# Patient Record
Sex: Female | Born: 1980 | Race: Asian | Hispanic: No | Marital: Married | Smoking: Never smoker
Health system: Southern US, Community
[De-identification: ages and names within clinical notes are randomized; demographics above are authoritative.]

## PROBLEM LIST (undated history)

## (undated) ENCOUNTER — Inpatient Hospital Stay (HOSPITAL_COMMUNITY): Payer: Self-pay

## (undated) DIAGNOSIS — L409 Psoriasis, unspecified: Secondary | ICD-10-CM

## (undated) DIAGNOSIS — S52122A Displaced fracture of head of left radius, initial encounter for closed fracture: Secondary | ICD-10-CM

## (undated) HISTORY — PX: OTHER SURGICAL HISTORY: SHX169

---

## 2006-05-20 DIAGNOSIS — S52122A Displaced fracture of head of left radius, initial encounter for closed fracture: Secondary | ICD-10-CM

## 2006-05-20 HISTORY — PX: OTHER SURGICAL HISTORY: SHX169

## 2006-05-20 HISTORY — DX: Displaced fracture of head of left radius, initial encounter for closed fracture: S52.122A

## 2014-05-20 HISTORY — PX: REFRACTIVE SURGERY: SHX103

## 2017-08-25 ENCOUNTER — Inpatient Hospital Stay (HOSPITAL_COMMUNITY): Payer: BLUE CROSS/BLUE SHIELD

## 2017-08-25 ENCOUNTER — Inpatient Hospital Stay (HOSPITAL_COMMUNITY)
Admission: AD | Admit: 2017-08-25 | Discharge: 2017-08-25 | Disposition: A | Discharge status: Home / Self Care | Payer: BLUE CROSS/BLUE SHIELD | Source: Ambulatory Visit | Attending: Obstetrics and Gynecology | Admitting: Obstetrics and Gynecology

## 2017-08-25 ENCOUNTER — Other Ambulatory Visit: Payer: Self-pay

## 2017-08-25 ENCOUNTER — Encounter (HOSPITAL_COMMUNITY): Payer: Self-pay

## 2017-08-25 DIAGNOSIS — Z3A01 Less than 8 weeks gestation of pregnancy: Secondary | ICD-10-CM | POA: Insufficient documentation

## 2017-08-25 DIAGNOSIS — O209 Hemorrhage in early pregnancy, unspecified: Secondary | ICD-10-CM | POA: Insufficient documentation

## 2017-08-25 DIAGNOSIS — R109 Unspecified abdominal pain: Secondary | ICD-10-CM | POA: Diagnosis present

## 2017-08-25 DIAGNOSIS — O283 Abnormal ultrasonic finding on antenatal screening of mother: Secondary | ICD-10-CM

## 2017-08-25 DIAGNOSIS — O3680X0 Pregnancy with inconclusive fetal viability, not applicable or unspecified: Secondary | ICD-10-CM

## 2017-08-25 HISTORY — DX: Psoriasis, unspecified: L40.9

## 2017-08-25 LAB — HCG, QUANTITATIVE, PREGNANCY: HCG, BETA CHAIN, QUANT, S: 3077 m[IU]/mL — AB (ref <5)

## 2017-08-25 LAB — WET PREP, GENITAL
CLUE CELLS WET PREP: NONE SEEN
Sperm: NONE SEEN
Trich, Wet Prep: NONE SEEN
Yeast Wet Prep HPF POC: NONE SEEN

## 2017-08-25 LAB — CBC
HCT: 37.5 % (ref 36.0–46.0)
Hemoglobin: 12.6 g/dL (ref 12.0–15.0)
MCH: 29.8 pg (ref 26.0–34.0)
MCHC: 33.6 g/dL (ref 30.0–36.0)
MCV: 88.7 fL (ref 78.0–100.0)
PLATELETS: 277 10*3/uL (ref 150–400)
RBC: 4.23 MIL/uL (ref 3.87–5.11)
RDW: 13.4 % (ref 11.5–15.5)
WBC: 6.3 10*3/uL (ref 4.0–10.5)

## 2017-08-25 LAB — POCT PREGNANCY, URINE: PREG TEST UR: POSITIVE — AB

## 2017-08-25 LAB — ABO/RH: ABO/RH(D): AB POS

## 2017-08-25 NOTE — MAU Note (Signed)
Pt states she had one episode of a small gush of bright red bleeding last week that has since resolved to brown vaginal discharge.  Today she noticed bright red bleeding that she has needed a panty liner for.  States panty liner is not full, but discharge has been persistent. Endorses mild cramping for a couple weeks, describes it as "abdominal fullness".  Denies urinary symptoms or back pain. +HPT approx 3 weeks ago.

## 2017-08-25 NOTE — MAU Provider Note (Signed)
Chief Complaint: Vaginal Bleeding and Abdominal Cramping   First Provider Initiated Contact with Patient 08/25/17 1516      SUBJECTIVE HPI: Cassandra Diaz is a 37 y.o. G1P0 at 56w2dby sure LMP and ovulation kit who presents to maternity admissions reporting watery red vaginal spotting 1 week ago after exercise followed by intermittent brown discharge x 2-3 days. Then today, she had red spotting again. Bleeding has been light and required a panty liner only. She denies any pain. There are no other associated symptoms. She has not tried any treatments. She denies vaginal itching/burning, urinary symptoms, h/a, dizziness, n/v, or fever/chills.     HPI  Past Medical History:  Diagnosis Date  . Psoriasis    Past Surgical History:  Procedure Laterality Date  . left radial head replacement  2008  . REFRACTIVE SURGERY  2016   Social History   Socioeconomic History  . Marital status: Married    Spouse name: Not on file  . Number of children: Not on file  . Years of education: Not on file  . Highest education level: Not on file  Occupational History  . Not on file  Social Needs  . Financial resource strain: Not on file  . Food insecurity:    Worry: Not on file    Inability: Not on file  . Transportation needs:    Medical: Not on file    Non-medical: Not on file  Tobacco Use  . Smoking status: Never Smoker  . Smokeless tobacco: Never Used  Substance and Sexual Activity  . Alcohol use: Not Currently  . Drug use: Not Currently  . Sexual activity: Not on file  Lifestyle  . Physical activity:    Days per week: Not on file    Minutes per session: Not on file  . Stress: Not on file  Relationships  . Social connections:    Talks on phone: Not on file    Gets together: Not on file    Attends religious service: Not on file    Active member of club or organization: Not on file    Attends meetings of clubs or organizations: Not on file    Relationship status: Not on file  .  Intimate partner violence:    Fear of current or ex partner: Not on file    Emotionally abused: Not on file    Physically abused: Not on file    Forced sexual activity: Not on file  Other Topics Concern  . Not on file  Social History Narrative  . Not on file   No current facility-administered medications on file prior to encounter.    Current Outpatient Medications on File Prior to Encounter  Medication Sig Dispense Refill  . Prenatal Vit-Fe Fumarate-FA (PRENATAL MULTIVITAMIN) TABS tablet Take 1 tablet by mouth daily at 12 noon.     No Known Allergies  ROS:  Review of Systems  Constitutional: Negative for chills, fatigue and fever.  Respiratory: Negative for shortness of breath.   Cardiovascular: Negative for chest pain.  Gastrointestinal: Negative for nausea and vomiting.  Genitourinary: Positive for vaginal bleeding. Negative for difficulty urinating, dysuria, flank pain, pelvic pain, vaginal discharge and vaginal pain.  Neurological: Negative for dizziness and headaches.  Psychiatric/Behavioral: Negative.      I have reviewed patient's Past Medical Hx, Surgical Hx, Family Hx, Social Hx, medications and allergies.   Physical Exam   Patient Vitals for the past 24 hrs:  BP Temp Temp src Pulse Resp SpO2 Height Weight  08/25/17 1730 133/83 98.6 F (37 C) Oral 64 16 - - -  08/25/17 1422 127/80 98.7 F (37.1 C) Oral 67 16 100 % 5' 1"  (1.549 m) 134 lb (60.8 kg)   Constitutional: Well-developed, well-nourished female in no acute distress.  Cardiovascular: normal rate Respiratory: normal effort GI: Abd soft, non-tender. Pos BS x 4 MS: Extremities nontender, no edema, normal ROM Neurologic: Alert and oriented x 4.  GU: Neg CVAT.  PELVIC EXAM: Cervix pink, visually closed, friable to cotton swab, scant brown discharge vaginal walls and external genitalia normal Bimanual exam: Cervix 0/long/high, firm, anterior, neg CMT, uterus nontender, nonenlarged, adnexa without  tenderness, enlargement, or mass   LAB RESULTS Results for orders placed or performed during the hospital encounter of 08/25/17 (from the past 24 hour(s))  Pregnancy, urine POC     Status: Abnormal   Collection Time: 08/25/17  2:27 PM  Result Value Ref Range   Preg Test, Ur POSITIVE (A) NEGATIVE  Wet prep, genital     Status: Abnormal   Collection Time: 08/25/17  3:32 PM  Result Value Ref Range   Yeast Wet Prep HPF POC NONE SEEN NONE SEEN   Trich, Wet Prep NONE SEEN NONE SEEN   Clue Cells Wet Prep HPF POC NONE SEEN NONE SEEN   WBC, Wet Prep HPF POC FEW (A) NONE SEEN   Sperm NONE SEEN   CBC     Status: None   Collection Time: 08/25/17  3:46 PM  Result Value Ref Range   WBC 6.3 4.0 - 10.5 K/uL   RBC 4.23 3.87 - 5.11 MIL/uL   Hemoglobin 12.6 12.0 - 15.0 g/dL   HCT 37.5 36.0 - 46.0 %   MCV 88.7 78.0 - 100.0 fL   MCH 29.8 26.0 - 34.0 pg   MCHC 33.6 30.0 - 36.0 g/dL   RDW 13.4 11.5 - 15.5 %   Platelets 277 150 - 400 K/uL  hCG, quantitative, pregnancy     Status: Abnormal   Collection Time: 08/25/17  3:46 PM  Result Value Ref Range   hCG, Beta Chain, Quant, S 3,077 (H) <5 mIU/mL  ABO/Rh     Status: None (Preliminary result)   Collection Time: 08/25/17  3:46 PM  Result Value Ref Range   ABO/RH(D)      AB POS Performed at [facility], [address], [place], [place] [address]     --/--/AB POS Performed at [facility], [address], [place], [place] [address]  [phone]/08 [phone])  IMAGING US Ob Comp Less 14 Wks  Result Date: 08/25/2017 CLINICAL DATA:  Vaginal bleeding. EXAM: OBSTETRIC <14 WK [place] AND TRANSVAGINAL OB US TECHNIQUE: Both transabdominal and transvaginal ultrasound examinations were performed for complete evaluation of the gestation as well as the maternal uterus, adnexal regions, and pelvic cul-de-sac. Transvaginal technique was performed to assess early pregnancy. COMPARISON:  None. FINDINGS: Intrauterine gestational sac: Single Yolk sac:  No Embryo:  None  Cardiac Activity: No MSD: 5.94 mm   5 w   2 d Maternal uterus/adnexae: Subchorionic hemorrhage: None Right ovary: Simple cyst in the right ovary measures 3.5 cm. Left ovary: Appears normal. Other :None Free fluid:  Trace IMPRESSION: 1. Probable early intrauterine gestational sac, but no yolk sac, fetal pole, or cardiac activity yet visualized. Recommend follow-up quantitative B-HCG levels and follow-up US in 14 days to confirm and assess viability. This recommendation follows SRU consensus guidelines: Diagnostic Criteria for Nonviable Pregnancy Early in the First Trimester. N Engl J Med 2013;  573:2202-54. Simple appearing right ovary cyst. 2. Trace free fluid within the pelvis. Electronically Signed   By: Kerby Moors M.D.   On: 08/25/2017 16:22   US Ob Transvaginal  Result Date: 08/25/2017 CLINICAL DATA:  Vaginal bleeding. EXAM: OBSTETRIC <14 WK [place] AND TRANSVAGINAL OB US TECHNIQUE: Both transabdominal and transvaginal ultrasound examinations were performed for complete evaluation of the gestation as well as the maternal uterus, adnexal regions, and pelvic cul-de-sac. Transvaginal technique was performed to assess early pregnancy. COMPARISON:  None. FINDINGS: Intrauterine gestational sac: Single Yolk sac:  No Embryo:  None Cardiac Activity: No MSD: 5.94 mm   5 w   2 d Maternal uterus/adnexae: Subchorionic hemorrhage: None Right ovary: Simple cyst in the right ovary measures 3.5 cm. Left ovary: Appears normal. Other :None Free fluid:  Trace IMPRESSION: 1. Probable early intrauterine gestational sac, but no yolk sac, fetal pole, or cardiac activity yet visualized. Recommend follow-up quantitative B-HCG levels and follow-up US in 14 days to confirm and assess viability. This recommendation follows SRU consensus guidelines: Diagnostic Criteria for Nonviable Pregnancy Early in the First Trimester. [place] Med 2013; 270:6237-62. Simple appearing right ovary cyst. 2. Trace free fluid within the pelvis. Electronically  Signed   By: Kerby Moors M.D.   On: 08/25/2017 16:22    MAU Management/MDM: Ordered labs and reviewed results.  Findings today could represent a normal early pregnancy, spontaneous abortion or ectopic pregnancy which can be life-threatening.  Ectopic precautions were given to the patient with plan to return in 48 hours for repeat quant hcg to evaluate pregnancy development.  Consult Dr Willis Modena with assessment and findings. Pt to call the office for quant hcg in 48 hours.  Return to MAU as needed for heavy bleeding or pain.  Pt discharged with strict ectopic precautions.  ASSESSMENT 1. Pregnancy of unknown anatomic location   2. Vaginal bleeding in pregnancy, first trimester     PLAN Discharge home Allergies as of 08/25/2017   No Known Allergies     Medication List    TAKE these medications   prenatal multivitamin Tabs tablet Take 1 tablet by mouth daily at 12 noon.      Follow-up Information    Meisinger, Sherren Mocha, MD Follow up.   Specialty:  Obstetrics and Gynecology Why:  Call for appointment on Wednesday, 08/27/17, for pregnancy hormone level. Return to MAU as needed for emergencies.  Contact information: [address], [address] [place] [address] [phone]           Fatima Blank Certified Nurse-Midwife 08/25/2017  5:57 PM

## 2017-08-26 ENCOUNTER — Telehealth: Payer: Self-pay | Admitting: Advanced Practice Midwife

## 2017-08-26 DIAGNOSIS — O3680X0 Pregnancy with inconclusive fetal viability, not applicable or unspecified: Secondary | ICD-10-CM | POA: Insufficient documentation

## 2017-08-26 LAB — GC/CHLAMYDIA PROBE AMP (~~LOC~~) NOT AT ARMC
Chlamydia: NEGATIVE
Neisseria Gonorrhea: NEGATIVE

## 2017-08-26 LAB — HIV ANTIBODY (ROUTINE TESTING W REFLEX): HIV SCREEN 4TH GENERATION: NONREACTIVE

## 2017-08-26 NOTE — Telephone Encounter (Signed)
Came to MAU for F/U quant. W/as seen in MAU yesterday afternoon. Dx PUL. No worsening of Sx. Called Dr. Mindi SlickerBanga to clarify where pt should F/U and when. Needs quant 4/10 afternoon. [facility] going to [facility] Dr. Mindi SlickerBanga will call pt to let her know what her partners prefer. Instructed pt that is she isn't [facility] she should F/U at [facility] 4/10 at 1400.  Katrinka BlazingSmith, [place], [place] 08/26/2017 10:59 PM'

## 2017-08-27 ENCOUNTER — Ambulatory Visit: Payer: BLUE CROSS/BLUE SHIELD

## 2017-08-29 ENCOUNTER — Telehealth: Payer: Self-pay

## 2017-08-29 NOTE — Telephone Encounter (Signed)
LM for pt to return call to reschedule her missed appt and that I am also sending a MyChart message.

## 2018-05-20 NOTE — L&D Delivery Note (Addendum)
Delivery Note Ptt pushed very well for about a half-hour for delivery.  At 11:26 PM a viable and healthy female was delivered via  (Presentation: OA; LOT).  APGAR: 9, 9; weight P .   Placenta status: delivered, intact.  Cord: 3V  with the following complications: none.    Anesthesia: epidural  Episiotomy:  none Lacerations: 2nd degree Suture Repair: 3.0 vicryl rapide Est. Blood Loss (mL):  175cc  Mom to postpartum.  Baby to Couplet care / Skin to Skin.  Cassandra Diaz 04/02/2019, 11:52 PM  AB+/RI/Tdap in PNC/Br/Contra?

## 2018-06-24 ENCOUNTER — Other Ambulatory Visit: Payer: Self-pay

## 2018-06-24 ENCOUNTER — Encounter (HOSPITAL_BASED_OUTPATIENT_CLINIC_OR_DEPARTMENT_OTHER): Payer: Self-pay

## 2018-06-24 NOTE — Progress Notes (Signed)
Spoke with:  Aira NPO:  No food after midnight/Clear liquids until 4:30AM DOS, Pre surgery drink at 4:30AM Arrival time:  0530AM Labs: UPT (CBC 06/26/2018 in epic) AM medications:  None Pre op orders: Yes Ride home:  Autumn Patty (husband) [phone]

## 2018-06-26 ENCOUNTER — Encounter (HOSPITAL_COMMUNITY)
Admission: RE | Admit: 2018-06-26 | Discharge: 2018-06-26 | Disposition: A | Discharge status: Home / Self Care | Payer: PRIVATE HEALTH INSURANCE | Source: Ambulatory Visit | Attending: Obstetrics and Gynecology | Admitting: Obstetrics and Gynecology

## 2018-06-26 DIAGNOSIS — Z01812 Encounter for preprocedural laboratory examination: Secondary | ICD-10-CM | POA: Insufficient documentation

## 2018-06-26 LAB — CBC
HCT: 36.2 % (ref 36.0–46.0)
Hemoglobin: 11.8 g/dL — ABNORMAL LOW (ref 12.0–15.0)
MCH: 30.6 pg (ref 26.0–34.0)
MCHC: 32.6 g/dL (ref 30.0–36.0)
MCV: 93.8 fL (ref 80.0–100.0)
Platelets: 256 10*3/uL (ref 150–400)
RBC: 3.86 MIL/uL — ABNORMAL LOW (ref 3.87–5.11)
RDW: 12.1 % (ref 11.5–15.5)
WBC: 5.2 10*3/uL (ref 4.0–10.5)
nRBC: 0 % (ref 0.0–0.2)

## 2018-06-29 NOTE — Anesthesia Preprocedure Evaluation (Addendum)
Anesthesia Evaluation  Patient identified by MRN, date of birth, ID band Patient awake    Reviewed: Allergy & Precautions, NPO status , Patient's Chart, lab work & pertinent test results  History of Anesthesia Complications Negative for: history of anesthetic complications  Airway Mallampati: I  TM Distance: >3 FB Neck ROM: Full    Dental  (+) Teeth Intact, Dental Advisory Given   Pulmonary neg pulmonary ROS,    Pulmonary exam normal breath sounds clear to auscultation       Cardiovascular negative cardio ROS Normal cardiovascular exam Rhythm:Regular Rate:Normal     Neuro/Psych negative neurological ROS     GI/Hepatic negative GI ROS, Neg liver ROS,   Endo/Other  negative endocrine ROS  Renal/GU negative Renal ROS     Musculoskeletal negative musculoskeletal ROS (+)   Abdominal   Peds  Hematology negative hematology ROS (+)   Anesthesia Other Findings Day of surgery medications reviewed with the patient.  Reproductive/Obstetrics Endometrial polyps                            Anesthesia Physical Anesthesia Plan  ASA: I  Anesthesia Plan: General   Post-op Pain Management:    Induction: Intravenous  PONV Risk Score and Plan: 3 and Treatment may vary due to age or medical condition, Ondansetron, Dexamethasone, Midazolam and Scopolamine patch - Pre-op  Airway Management Planned: LMA  Additional Equipment:   Intra-op Plan:   Post-operative Plan: Extubation in OR  Informed Consent: I have reviewed the patients History and Physical, chart, labs and discussed the procedure including the risks, benefits and alternatives for the proposed anesthesia with the patient or authorized representative who has indicated his/her understanding and acceptance.     Dental advisory given  Plan Discussed with: CRNA  Anesthesia Plan Comments:        Anesthesia Quick Evaluation

## 2018-06-29 NOTE — H&P (Signed)
Cassandra Diaz is an 38 y.o. female. She recently had an HSG for work-up of infertility which revealed an irregular endometrium consistent with polyp, read by Dr. April Manson.  He recommended hysteroscopy, D&C and resection of possible polyps.  She has been having regular menses monthly without problems.   Pertinent Gynecological History: Last pap: normal Date: 12/2016 OB History: G1, P0010 SAB   Menstrual History: Patient's last menstrual period was 06/06/2017 (exact date).    Past Medical History:  Diagnosis Date  . Left radial head fracture 2008  . Psoriasis     Past Surgical History:  Procedure Laterality Date  . left radial head replacement  2008  . REFRACTIVE SURGERY  2016    History reviewed. No pertinent family history.  Social History:  reports that she has never smoked. She has never used smokeless tobacco. She reports current alcohol use. She reports previous drug use.  Allergies: No Known Allergies  No medications prior to admission.    Review of Systems  Respiratory: Negative.   Cardiovascular: Negative.     Height 5\' 1"  (1.549 m), weight 63.5 kg, last menstrual period 06/06/2017, unknown if currently breastfeeding. Physical Exam  Constitutional: She appears well-developed and well-nourished.  Neck: Neck supple. No thyromegaly present.  Cardiovascular: Normal rate, regular rhythm and normal heart sounds.  No murmur heard. Respiratory: Effort normal and breath sounds normal. No respiratory distress. She has no wheezes.  GI: Soft. She exhibits no distension and no mass. There is no abdominal tenderness.  Genitourinary:    Vagina and uterus normal.     No results found for this or any previous visit (from the past 24 hour(s)).  No results found.  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  Discussed surgical procedure and risks.  Will admit for hysteroscopy/D&C and possible resection of polyps.  Cassandra Diaz  Cassandra Diaz 06/29/2018, 5:19 PM

## 2018-06-30 ENCOUNTER — Encounter (HOSPITAL_BASED_OUTPATIENT_CLINIC_OR_DEPARTMENT_OTHER): Payer: Self-pay

## 2018-06-30 ENCOUNTER — Other Ambulatory Visit: Payer: Self-pay

## 2018-06-30 ENCOUNTER — Encounter (HOSPITAL_BASED_OUTPATIENT_CLINIC_OR_DEPARTMENT_OTHER)
Admission: RE | Disposition: A | Discharge status: Home / Self Care | Payer: Self-pay | Source: Other Acute Inpatient Hospital | Attending: Obstetrics and Gynecology

## 2018-06-30 ENCOUNTER — Ambulatory Visit (HOSPITAL_BASED_OUTPATIENT_CLINIC_OR_DEPARTMENT_OTHER): Payer: PRIVATE HEALTH INSURANCE | Admitting: Anesthesiology

## 2018-06-30 ENCOUNTER — Ambulatory Visit (HOSPITAL_BASED_OUTPATIENT_CLINIC_OR_DEPARTMENT_OTHER)
Admission: RE | Admit: 2018-06-30 | Discharge: 2018-06-30 | Disposition: A | Discharge status: Home / Self Care | Payer: PRIVATE HEALTH INSURANCE | Source: Other Acute Inpatient Hospital | Attending: Obstetrics and Gynecology | Admitting: Obstetrics and Gynecology

## 2018-06-30 DIAGNOSIS — N840 Polyp of corpus uteri: Secondary | ICD-10-CM | POA: Diagnosis present

## 2018-06-30 DIAGNOSIS — Z96622 Presence of left artificial elbow joint: Secondary | ICD-10-CM | POA: Diagnosis not present

## 2018-06-30 DIAGNOSIS — N979 Female infertility, unspecified: Secondary | ICD-10-CM | POA: Diagnosis not present

## 2018-06-30 HISTORY — PX: DILATATION & CURETTAGE/HYSTEROSCOPY WITH MYOSURE: SHX6511

## 2018-06-30 HISTORY — DX: Displaced fracture of head of left radius, initial encounter for closed fracture: S52.122A

## 2018-06-30 LAB — POCT PREGNANCY, URINE: Preg Test, Ur: NEGATIVE

## 2018-06-30 SURGERY — DILATATION & CURETTAGE/HYSTEROSCOPY WITH MYOSURE
Anesthesia: General | Site: Vagina

## 2018-06-30 MED ORDER — ONDANSETRON HCL 4 MG/2ML IJ SOLN
INTRAMUSCULAR | Status: AC
  Filled 2018-06-30: qty 2

## 2018-06-30 MED ORDER — LIDOCAINE HCL 2 % IJ SOLN
INTRAMUSCULAR | Status: DC | PRN
  Administered 2018-06-30: 16 mL

## 2018-06-30 MED ORDER — MIDAZOLAM HCL 5 MG/5ML IJ SOLN
INTRAMUSCULAR | Status: DC | PRN
  Administered 2018-06-30: 2 mg via INTRAVENOUS

## 2018-06-30 MED ORDER — KETOROLAC TROMETHAMINE 30 MG/ML IJ SOLN
INTRAMUSCULAR | Status: AC
  Filled 2018-06-30: qty 1

## 2018-06-30 MED ORDER — LACTATED RINGERS IV SOLN
INTRAVENOUS | Status: DC
  Administered 2018-06-30: 1000 mL via INTRAVENOUS
  Administered 2018-06-30: 06:00:00 via INTRAVENOUS
  Filled 2018-06-30: qty 1000

## 2018-06-30 MED ORDER — PROMETHAZINE HCL 25 MG/ML IJ SOLN
6.2500 mg | INTRAMUSCULAR | Status: DC | PRN
  Filled 2018-06-30: qty 1

## 2018-06-30 MED ORDER — OXYCODONE HCL 5 MG/5ML PO SOLN
5.0000 mg | Freq: Once | ORAL | Status: DC | PRN
  Filled 2018-06-30: qty 5

## 2018-06-30 MED ORDER — GLYCOPYRROLATE PF 0.2 MG/ML IJ SOSY
PREFILLED_SYRINGE | INTRAMUSCULAR | Status: AC
  Filled 2018-06-30: qty 1

## 2018-06-30 MED ORDER — MIDAZOLAM HCL 2 MG/2ML IJ SOLN
INTRAMUSCULAR | Status: AC
  Filled 2018-06-30: qty 2

## 2018-06-30 MED ORDER — GLYCOPYRROLATE 0.2 MG/ML IJ SOLN
INTRAMUSCULAR | Status: DC | PRN
  Administered 2018-06-30: 0.2 mg via INTRAVENOUS

## 2018-06-30 MED ORDER — SCOPOLAMINE 1 MG/3DAYS TD PT72
MEDICATED_PATCH | TRANSDERMAL | Status: DC | PRN
  Administered 2018-06-30: 1 via TRANSDERMAL

## 2018-06-30 MED ORDER — DEXAMETHASONE SODIUM PHOSPHATE 10 MG/ML IJ SOLN
INTRAMUSCULAR | Status: AC
  Filled 2018-06-30: qty 1

## 2018-06-30 MED ORDER — FENTANYL CITRATE (PF) 100 MCG/2ML IJ SOLN
INTRAMUSCULAR | Status: DC | PRN
  Administered 2018-06-30: 25 ug via INTRAVENOUS
  Administered 2018-06-30: 50 ug via INTRAVENOUS
  Administered 2018-06-30: 25 ug via INTRAVENOUS

## 2018-06-30 MED ORDER — PROPOFOL 10 MG/ML IV BOLUS
INTRAVENOUS | Status: AC
  Filled 2018-06-30: qty 40

## 2018-06-30 MED ORDER — SODIUM CHLORIDE 0.9 % IR SOLN
Status: DC | PRN
  Administered 2018-06-30: 3000 mL

## 2018-06-30 MED ORDER — KETOROLAC TROMETHAMINE 30 MG/ML IJ SOLN
INTRAMUSCULAR | Status: DC | PRN
  Administered 2018-06-30: 30 mg via INTRAVENOUS

## 2018-06-30 MED ORDER — ONDANSETRON HCL 4 MG/2ML IJ SOLN
INTRAMUSCULAR | Status: DC | PRN
  Administered 2018-06-30: 4 mg via INTRAVENOUS

## 2018-06-30 MED ORDER — OXYCODONE HCL 5 MG PO TABS
5.0000 mg | ORAL_TABLET | Freq: Once | ORAL | Status: DC | PRN
  Filled 2018-06-30: qty 1

## 2018-06-30 MED ORDER — ACETAMINOPHEN 10 MG/ML IV SOLN
1000.0000 mg | Freq: Once | INTRAVENOUS | Status: DC | PRN
  Filled 2018-06-30: qty 100

## 2018-06-30 MED ORDER — SCOPOLAMINE 1 MG/3DAYS TD PT72
MEDICATED_PATCH | TRANSDERMAL | Status: AC
  Filled 2018-06-30: qty 1

## 2018-06-30 MED ORDER — LIDOCAINE HCL (CARDIAC) PF 100 MG/5ML IV SOSY
PREFILLED_SYRINGE | INTRAVENOUS | Status: DC | PRN
  Administered 2018-06-30: 80 mg via INTRAVENOUS

## 2018-06-30 MED ORDER — PROPOFOL 10 MG/ML IV BOLUS
INTRAVENOUS | Status: DC | PRN
  Administered 2018-06-30: 120 mg via INTRAVENOUS

## 2018-06-30 MED ORDER — FENTANYL CITRATE (PF) 100 MCG/2ML IJ SOLN
INTRAMUSCULAR | Status: AC
  Filled 2018-06-30: qty 2

## 2018-06-30 MED ORDER — FENTANYL CITRATE (PF) 100 MCG/2ML IJ SOLN
25.0000 ug | INTRAMUSCULAR | Status: DC | PRN
  Filled 2018-06-30: qty 1

## 2018-06-30 MED ORDER — DEXAMETHASONE SODIUM PHOSPHATE 4 MG/ML IJ SOLN
INTRAMUSCULAR | Status: DC | PRN
  Administered 2018-06-30: 10 mg via INTRAVENOUS

## 2018-06-30 SURGICAL SUPPLY — 34 items
BIPOLAR CUTTING LOOP 21FR (ELECTRODE)
CANISTER SUCT 3000ML PPV (MISCELLANEOUS) ×3 IMPLANT
CATH ROBINSON RED A/P 16FR (CATHETERS) ×3 IMPLANT
COVER WAND RF STERILE (DRAPES) ×3 IMPLANT
DEVICE MYOSURE LITE (MISCELLANEOUS) ×3 IMPLANT
DEVICE MYOSURE REACH (MISCELLANEOUS) IMPLANT
DILATOR CANAL MILEX (MISCELLANEOUS) IMPLANT
ELECT REM PT RETURN 9FT ADLT (ELECTROSURGICAL)
ELECTRODE REM PT RTRN 9FT ADLT (ELECTROSURGICAL) IMPLANT
GAUZE 4X4 16PLY RFD (DISPOSABLE) ×3 IMPLANT
GLOVE BIO SURGEON STRL SZ 6.5 (GLOVE) ×2 IMPLANT
GLOVE BIO SURGEON STRL SZ7 (GLOVE) ×3 IMPLANT
GLOVE BIO SURGEONS STRL SZ 6.5 (GLOVE) ×1
GLOVE BIOGEL PI IND STRL 6.5 (GLOVE) ×1 IMPLANT
GLOVE BIOGEL PI IND STRL 7.0 (GLOVE) ×1 IMPLANT
GLOVE BIOGEL PI INDICATOR 6.5 (GLOVE) ×2
GLOVE BIOGEL PI INDICATOR 7.0 (GLOVE) ×2
GLOVE ORTHO TXT STRL SZ7.5 (GLOVE) ×3 IMPLANT
GOWN STRL REUS W/ TWL XL LVL3 (GOWN DISPOSABLE) ×1 IMPLANT
GOWN STRL REUS W/TWL LRG LVL3 (GOWN DISPOSABLE) ×6 IMPLANT
GOWN STRL REUS W/TWL XL LVL3 (GOWN DISPOSABLE) ×2
IV NS IRRIG 3000ML ARTHROMATIC (IV SOLUTION) ×3 IMPLANT
KIT PROCEDURE FLUENT (KITS) ×3 IMPLANT
KIT TURNOVER CYSTO (KITS) ×3 IMPLANT
LOOP CUTTING BIPOLAR 21FR (ELECTRODE) IMPLANT
MYOSURE LITE POLYP REMOVAL (MISCELLANEOUS) IMPLANT
MYOSURE XL FIBROID (MISCELLANEOUS)
PACK VAGINAL MINOR WOMEN LF (CUSTOM PROCEDURE TRAY) ×3 IMPLANT
PAD OB MATERNITY 4.3X12.25 (PERSONAL CARE ITEMS) ×3 IMPLANT
PAD PREP 24X48 CUFFED NSTRL (MISCELLANEOUS) ×3 IMPLANT
SEAL ROD LENS SCOPE MYOSURE (ABLATOR) ×3 IMPLANT
SYSTEM TISS REMOVAL MYOSURE XL (MISCELLANEOUS) IMPLANT
TOWEL OR 17X26 10 PK STRL BLUE (TOWEL DISPOSABLE) ×6 IMPLANT
WATER STERILE IRR 500ML POUR (IV SOLUTION) IMPLANT

## 2018-06-30 NOTE — Anesthesia Procedure Notes (Signed)
Procedure Name: LMA Insertion Date/Time: 06/30/2018 7:27 AM Performed by: Jessica Priest, CRNA Pre-anesthesia Checklist: Patient identified, Emergency Drugs available, Suction available and Patient being monitored Patient Re-evaluated:Patient Re-evaluated prior to induction Oxygen Delivery Method: Circle system utilized Preoxygenation: Pre-oxygenation with 100% oxygen Induction Type: IV induction Ventilation: Mask ventilation without difficulty LMA: LMA inserted LMA Size: 4.0 Number of attempts: 1 Airway Equipment and Method: Bite block Placement Confirmation: positive ETCO2 and breath sounds checked- equal and bilateral Tube secured with: Tape Dental Injury: Teeth and Oropharynx as per pre-operative assessment

## 2018-06-30 NOTE — Anesthesia Postprocedure Evaluation (Signed)
Anesthesia Post Note  Patient: Surveyor, mining  Procedure(s) Performed: DILATATION & CURETTAGE/HYSTEROSCOPY WITH MYOSURE (N/A Vagina )     Patient location during evaluation: PACU Anesthesia Type: General Level of consciousness: awake and alert Pain management: pain level controlled Vital Signs Assessment: post-procedure vital signs reviewed and stable Respiratory status: spontaneous breathing, nonlabored ventilation and respiratory function stable Cardiovascular status: blood pressure returned to baseline and stable Postop Assessment: no apparent nausea or vomiting Anesthetic complications: no    Last Vitals:  Vitals:   06/30/18 0547 06/30/18 0811  BP: 108/76 103/67  Pulse: (!) 56 84  Resp: 14 18  Temp: 37.1 C 36.7 C  SpO2: 100% 100%    Last Pain:  Vitals:   06/30/18 0811  TempSrc:   PainSc: 0-No pain                 Kaylyn Layer

## 2018-06-30 NOTE — Transfer of Care (Signed)
Immediate Anesthesia Transfer of Care Note  Patient: Cassandra Diaz  Procedure(s) Performed: Procedure(s) (LRB): DILATATION & CURETTAGE/HYSTEROSCOPY WITH MYOSURE (N/A)  Patient Location: PACU  Anesthesia Type: General  Level of Consciousness: awake, sedated, patient cooperative and responds to stimulation  Airway & Oxygen Therapy: Patient Spontanous Breathing and Patient connected to [place] oxygen  Post-op Assessment: Report given to PACU RN, Post -op Vital signs reviewed and stable and Patient moving all extremities  Post vital signs: Reviewed and stable  Complications: No apparent anesthesia complications

## 2018-06-30 NOTE — Discharge Instructions (Signed)
DISCHARGE INSTRUCTIONS: D&C    Personal hygiene:  Use sanitary pads for vaginal drainage, not tampons.  Shower the day after your procedure.  NO tub baths, pools or Jacuzzis for 2-3 weeks.  Wipe front to back after using the bathroom.  Activity and limitations:  Do NOT drive or operate any equipment for 24 hours. The effects of anesthesia are still present and drowsiness may result.  Do NOT rest in bed all day.  Walking is encouraged.  Walk up and down stairs slowly.  You may resume your normal activity in one to two days or as indicated by your physician.  Sexual activity: NO intercourse for at least 2 weeks after the procedure, or as indicated by your physician.  Diet: Eat a light meal as desired this evening. You may resume your usual diet tomorrow.  Return to work: You may resume your work activities in one to two days or as indicated by your doctor.  What to expect after your surgery: Expect to have vaginal bleeding/discharge for 2-3 days and spotting for up to 10 days. It is not unusual to have soreness for up to 1-2 weeks. You may have a slight burning sensation when you urinate for the first day. Mild cramps may continue for a couple of days. You may have a regular period in 2-6 weeks.  Call your doctor for any of the following:  Excessive vaginal bleeding, saturating and changing one pad every hour.  Inability to urinate 6 hours after discharge from hospital.  Pain not relieved by pain medication.  Fever of 100.4 F or greater.  Unusual vaginal discharge or odor.   Post Anesthesia Home Care Instructions  Activity: Get plenty of rest for the remainder of the day. A responsible individual must stay with you for 24 hours following the procedure.  For the next 24 hours, DO NOT: -Drive a car -Advertising copywriterperate machinery -Drink alcoholic beverages -Take any medication unless instructed by your physician -Make any legal decisions or sign important  papers.  Meals: Start with liquid foods such as gelatin or soup. Progress to regular foods as tolerated. Avoid greasy, spicy, heavy foods. If nausea and/or vomiting occur, drink only clear liquids until the nausea and/or vomiting subsides. Call your physician if vomiting continues.  Special Instructions/Symptoms: Your throat may feel dry or sore from the anesthesia or the breathing tube placed in your throat during surgery. If this causes discomfort, gargle with warm salt water. The discomfort should disappear within 24 hours.  If you had a scopolamine patch placed behind your ear for the management of post- operative nausea and/or vomiting:  1. The medication in the patch is effective for 72 hours, after which it should be removed.  Wrap patch in a tissue and discard in the trash. Wash hands thoroughly with soap and water. 2. You may remove the patch earlier than 72 hours if you experience unpleasant side effects which may include dry mouth, dizziness or visual disturbances. 3. Avoid touching the patch. Wash your hands with soap and water after contact with the patch.

## 2018-06-30 NOTE — Op Note (Signed)
Preoperative diagnosis: Possible endometrial polyps by HSG Postoperative diagnosis: Same Procedure: Hysteroscopy, D&C,  with Myosure resection of possible polyps Surgeon: Lavina Hamman M.D. Anesthesia: Gen. With an LMA, paracervical block Findings: She had a normal endometrial cavity with abundant polypoid endometrial tissue Estimated blood loss: Minimal Fluid deficit: Through the hysteroscope fluid deficit was < 200 cc Specimens: Endometrial currettings and resection sent for routine pathology Complications: None  Procedure in detail: The patient was taken to the operating room and placed in the dorsosupine position. General anesthesia was induced. She was placed in mobile stirrups and legs were elevated. Perineum and vagina were prepped and draped in the usual sterile fashion and bladder drained with a red Robinson catheter. A Graves speculum was inserted in the vagina and the anterior lip of the cervix was grasped with a single-tooth tenaculum. Paracervical block was performed with a total of 16 cc of 2% plain lidocaine. The uterus then sounded to 8 cm. The cervix was gradually dilated to a size 7 Hegar dilator without difficulty. The Myosure hysteroscope was inserted and good visualization was achieved.  Initial assessment of the cavity was difficult due to abundant polypoid endometrial tissue.  D&C was performed with removal of a good amount of tissue, some polypoid.  Polyp forceps also used to remove an additional small amount of fluid. The Myosure Lite was inserted and all remaining endometrial tissue was completely resected.  The endometrial cavity was now normal, no other visible lesions.  The hysteroscope was removed. The single-tooth tenaculum was removed from the cervix and bleeding was controlled with pressure. All instruments were then removed from the vagina. The patient tolerated the procedure well and was taken to the recovery in stable condition. Counts were correct and she had PAS  hose on throughout the procedure.

## 2018-06-30 NOTE — Interval H&P Note (Signed)
History and Physical Interval Note:  06/30/2018 7:14 AM  Cassandra Diaz  has presented today for surgery, with the diagnosis of possible endometrial polyps  The various methods of treatment have been discussed with the patient and family. After consideration of risks, benefits and other options for treatment, the patient has consented to  Procedure(s): DILATATION & CURETTAGE/HYSTEROSCOPY WITH POSS MYOSURE (N/A) as a surgical intervention .  The patient's history has been reviewed, patient examined, no change in status, stable for surgery.  I have reviewed the patient's chart and labs.  Questions were answered to the patient's satisfaction.     Leighton Roach Joleah Kosak

## 2018-07-01 ENCOUNTER — Encounter (HOSPITAL_BASED_OUTPATIENT_CLINIC_OR_DEPARTMENT_OTHER): Payer: Self-pay | Admitting: Obstetrics and Gynecology

## 2019-04-02 ENCOUNTER — Other Ambulatory Visit: Payer: Self-pay

## 2019-04-02 ENCOUNTER — Inpatient Hospital Stay (HOSPITAL_COMMUNITY)
Admission: AD | Admit: 2019-04-02 | Discharge: 2019-04-04 | DRG: 807 | Disposition: A | Discharge status: Home / Self Care | Payer: PRIVATE HEALTH INSURANCE | Attending: Obstetrics and Gynecology | Admitting: Obstetrics and Gynecology

## 2019-04-02 ENCOUNTER — Inpatient Hospital Stay (HOSPITAL_COMMUNITY): Payer: PRIVATE HEALTH INSURANCE | Admitting: Anesthesiology

## 2019-04-02 ENCOUNTER — Encounter (HOSPITAL_COMMUNITY): Payer: Self-pay | Admitting: *Deleted

## 2019-04-02 DIAGNOSIS — E669 Obesity, unspecified: Secondary | ICD-10-CM | POA: Diagnosis present

## 2019-04-02 DIAGNOSIS — O26893 Other specified pregnancy related conditions, third trimester: Secondary | ICD-10-CM | POA: Diagnosis present

## 2019-04-02 DIAGNOSIS — O99214 Obesity complicating childbirth: Principal | ICD-10-CM | POA: Diagnosis present

## 2019-04-02 DIAGNOSIS — Z3A38 38 weeks gestation of pregnancy: Secondary | ICD-10-CM

## 2019-04-02 DIAGNOSIS — Z20828 Contact with and (suspected) exposure to other viral communicable diseases: Secondary | ICD-10-CM | POA: Diagnosis present

## 2019-04-02 LAB — CBC
HCT: 39.7 % (ref 36.0–46.0)
Hemoglobin: 13.3 g/dL (ref 12.0–15.0)
MCH: 31.2 pg (ref 26.0–34.0)
MCHC: 33.5 g/dL (ref 30.0–36.0)
MCV: 93.2 fL (ref 80.0–100.0)
Platelets: 290 10*3/uL (ref 150–400)
RBC: 4.26 MIL/uL (ref 3.87–5.11)
RDW: 12.7 % (ref 11.5–15.5)
WBC: 11.1 10*3/uL — ABNORMAL HIGH (ref 4.0–10.5)
nRBC: 0 % (ref 0.0–0.2)

## 2019-04-02 LAB — OB RESULTS CONSOLE HEPATITIS B SURFACE ANTIGEN: Hepatitis B Surface Ag: NEGATIVE

## 2019-04-02 LAB — SARS CORONAVIRUS 2 (TAT 6-24 HRS): SARS Coronavirus 2: NEGATIVE

## 2019-04-02 LAB — OB RESULTS CONSOLE RPR: RPR: NONREACTIVE

## 2019-04-02 LAB — TYPE AND SCREEN
ABO/RH(D): AB POS
Antibody Screen: NEGATIVE

## 2019-04-02 LAB — OB RESULTS CONSOLE HIV ANTIBODY (ROUTINE TESTING): HIV: NONREACTIVE

## 2019-04-02 LAB — ABO/RH: ABO/RH(D): AB POS

## 2019-04-02 LAB — OB RESULTS CONSOLE ABO/RH: RH Type: POSITIVE

## 2019-04-02 LAB — OB RESULTS CONSOLE GC/CHLAMYDIA
Chlamydia: NEGATIVE
Gonorrhea: NEGATIVE

## 2019-04-02 LAB — OB RESULTS CONSOLE ANTIBODY SCREEN: Antibody Screen: NEGATIVE

## 2019-04-02 LAB — OB RESULTS CONSOLE GBS: GBS: NEGATIVE

## 2019-04-02 LAB — OB RESULTS CONSOLE RUBELLA ANTIBODY, IGM: Rubella: IMMUNE

## 2019-04-02 MED ORDER — SODIUM CHLORIDE (PF) 0.9 % IJ SOLN
INTRAMUSCULAR | Status: DC | PRN
  Administered 2019-04-02: 11 mL/h via EPIDURAL

## 2019-04-02 MED ORDER — OXYCODONE-ACETAMINOPHEN 5-325 MG PO TABS: 1.0000 | ORAL_TABLET | ORAL | Status: DC | PRN

## 2019-04-02 MED ORDER — LACTATED RINGERS IV SOLN
INTRAVENOUS | Status: DC
  Administered 2019-04-02: 13:00:00 via INTRAVENOUS

## 2019-04-02 MED ORDER — OXYTOCIN 40 UNITS IN NORMAL SALINE INFUSION - SIMPLE MED
1.0000 m[IU]/min | INTRAVENOUS | Status: DC
  Administered 2019-04-02: 2 m[IU]/min via INTRAVENOUS
  Filled 2019-04-02: qty 1000

## 2019-04-02 MED ORDER — PHENYLEPHRINE 40 MCG/ML (10ML) SYRINGE FOR IV PUSH (FOR BLOOD PRESSURE SUPPORT): 80.0000 ug | PREFILLED_SYRINGE | INTRAVENOUS | Status: DC | PRN

## 2019-04-02 MED ORDER — LIDOCAINE HCL (PF) 1 % IJ SOLN: 30.0000 mL | INTRAMUSCULAR | Status: DC | PRN

## 2019-04-02 MED ORDER — ACETAMINOPHEN 325 MG PO TABS: 650.0000 mg | ORAL_TABLET | ORAL | Status: DC | PRN

## 2019-04-02 MED ORDER — ONDANSETRON HCL 4 MG/2ML IJ SOLN
4.0000 mg | Freq: Four times a day (QID) | INTRAMUSCULAR | Status: DC | PRN
  Administered 2019-04-02: 4 mg via INTRAVENOUS
  Filled 2019-04-02: qty 2

## 2019-04-02 MED ORDER — LIDOCAINE HCL (PF) 1 % IJ SOLN
INTRAMUSCULAR | Status: DC | PRN
  Administered 2019-04-02 (×2): 4 mL via EPIDURAL

## 2019-04-02 MED ORDER — OXYTOCIN BOLUS FROM INFUSION
500.0000 mL | Freq: Once | INTRAVENOUS | Status: AC
  Administered 2019-04-02: 500 mL via INTRAVENOUS

## 2019-04-02 MED ORDER — DIPHENHYDRAMINE HCL 50 MG/ML IJ SOLN: 12.5000 mg | INTRAMUSCULAR | Status: DC | PRN

## 2019-04-02 MED ORDER — ERYTHROMYCIN 5 MG/GM OP OINT
TOPICAL_OINTMENT | OPHTHALMIC | Status: AC
  Filled 2019-04-02: qty 1

## 2019-04-02 MED ORDER — SOD CITRATE-CITRIC ACID 500-334 MG/5ML PO SOLN: 30.0000 mL | ORAL | Status: DC | PRN

## 2019-04-02 MED ORDER — EPHEDRINE 5 MG/ML INJ: 10.0000 mg | INTRAVENOUS | Status: DC | PRN

## 2019-04-02 MED ORDER — LACTATED RINGERS IV SOLN: 500.0000 mL | Freq: Once | INTRAVENOUS | Status: DC

## 2019-04-02 MED ORDER — TERBUTALINE SULFATE 1 MG/ML IJ SOLN: 0.2500 mg | Freq: Once | INTRAMUSCULAR | Status: DC | PRN

## 2019-04-02 MED ORDER — LACTATED RINGERS IV SOLN
500.0000 mL | Freq: Once | INTRAVENOUS | Status: AC
  Administered 2019-04-02: 500 mL via INTRAVENOUS

## 2019-04-02 MED ORDER — LACTATED RINGERS IV SOLN: 500.0000 mL | INTRAVENOUS | Status: DC | PRN

## 2019-04-02 MED ORDER — OXYCODONE-ACETAMINOPHEN 5-325 MG PO TABS: 2.0000 | ORAL_TABLET | ORAL | Status: DC | PRN

## 2019-04-02 MED ORDER — FENTANYL-BUPIVACAINE-NACL 0.5-0.125-0.9 MG/250ML-% EP SOLN
12.0000 mL/h | EPIDURAL | Status: DC | PRN
  Filled 2019-04-02: qty 250

## 2019-04-02 MED ORDER — OXYTOCIN 40 UNITS IN NORMAL SALINE INFUSION - SIMPLE MED: 2.5000 [IU]/h | INTRAVENOUS | Status: DC

## 2019-04-02 NOTE — H&P (Signed)
Cassandra Diaz is a 38 y.o. female G1P0 at 47+ with irregular contractions and ROM at 9am.  Clear fluid.  Pt is AMA with low risk panorama/female.  Received Flu and Tdap in [facility].  Dated by LMP.    OB History    Gravida  2   Para  0   Term  0   Preterm  0   AB  1   Living  0     SAB  1   TAB  0   Ectopic  0   Multiple  0   Live Births  0         G1 SAB G2 present No abn pap, last 2018 No STD  Past Medical History:  Diagnosis Date  . Left radial head fracture 2008  . Psoriasis    Past Surgical History:  Procedure Laterality Date  . DILATATION & CURETTAGE/HYSTEROSCOPY WITH MYOSURE N/A 06/30/2018   Procedure: Cutter;  Surgeon: Cheri Fowler, MD;  Location: [facility];  Service: Gynecology;  Laterality: N/A;  . eating disorder    . left radial head replacement  2008  . REFRACTIVE SURGERY  2016   Family History: DM, ovarian cancer, fibrosis of lung. Social History:  reports that she has never smoked. She has never used smokeless tobacco. She reports current alcohol use. She reports that she does not use drugs. opthamologist, married Meds:PNV, also baby ASA  WFU:XNAT    Maternal Diabetes: No Genetic Screening: Normal Maternal Ultrasounds/Referrals: Normal Fetal Ultrasounds or other Referrals:  None Maternal Substance Abuse:  No Significant Maternal Medications:  None Significant Maternal Lab Results:  Group B Strep negative Other Comments:  panorama low risk  Review of Systems  Constitutional: Negative.   HENT: Negative.   Eyes: Negative.   Respiratory: Negative.   Cardiovascular: Negative.   Gastrointestinal: Negative.   Genitourinary: Negative.   Musculoskeletal: Negative.   Skin: Negative.   Neurological: Negative.   Psychiatric/Behavioral: Negative.    Maternal Medical History:  Reason for admission: Rupture of membranes.   Contractions: Frequency: irregular.   Perceived severity is  moderate.    Fetal activity: Perceived fetal activity is normal.    Prenatal Complications - Diabetes: none.    Dilation: 3 Effacement (%): 50 Station: -2 Exam by:: [place], rn Blood pressure 116/76, pulse 68, temperature 98.5 F (36.9 C), temperature source Oral, resp. rate 18, height 5\' 1"  (1.549 m), weight 82.6 kg, last menstrual period 06/06/2018, unknown if currently breastfeeding. Maternal Exam:  Abdomen: Patient reports no abdominal tenderness. Fundal height is appropriate for gestation.   Estimated fetal weight is 7.5-8#.   Fetal presentation: vertex  Introitus: Normal vulva. Normal vagina.  Ferning test: positive.  Nitrazine test: positive.  Cervix: Cervix evaluated by digital exam.     Physical Exam  Constitutional: She is oriented to person, place, and time. She appears well-developed and well-nourished.  HENT:  Head: Normocephalic and atraumatic.  Cardiovascular: Normal rate and regular rhythm.  Respiratory: Effort normal and breath sounds normal. No respiratory distress. She has no wheezes.  GI: Soft. Bowel sounds are normal. She exhibits no distension. There is no abdominal tenderness.  Genitourinary:    Vulva normal.   Musculoskeletal: Normal range of motion.  Neurological: She is alert and oriented to person, place, and time.  Skin: Skin is warm and dry.  Psychiatric: She has a normal mood and affect. Her behavior is normal.    Prenatal labs: ABO, Rh: --/--/PENDING (11/13 1231) AB+  Antibody: PENDING (11/13 1231) neg Rubella: Immune (11/13 0000) RPR: Nonreactive (11/13 0000)  HBsAg: Negative (11/13 0000)  HIV: Non-reactive (11/13 0000)  GBS: Negative/-- (11/13 0000)  Hgb 11.9/Plt 276/Ur C x neg/Chl neg/GC neg/Varicella immune/Essential panel neg - CF, SMA and Fragile X neg/ glucola 139 - nl 3hr/panorama low risk female  [place] nl anat, limited heart, post plac F/u US nl anat  Tdap and Flu 9/20  Assessment/Plan: 38yo G2P0 at 38+ with ROM gbbs neg - no  prophylaxis Augment with pitocin Epidural or IV pain meds Expect SVD  Zavon Hyson Bovard-Stuckert 04/02/2019, 1:23 PM

## 2019-04-02 NOTE — Anesthesia Preprocedure Evaluation (Signed)
Anesthesia Evaluation  Patient identified by MRN, date of birth, ID band Patient awake    Reviewed: Allergy & Precautions, Patient's Chart, lab work & pertinent test results  Airway Mallampati: II  TM Distance: >3 FB Neck ROM: Full    Dental no notable dental hx. (+) Teeth Intact   Pulmonary neg pulmonary ROS,    Pulmonary exam normal breath sounds clear to auscultation       Cardiovascular negative cardio ROS Normal cardiovascular exam Rhythm:Regular Rate:Normal     Neuro/Psych negative neurological ROS  negative psych ROS   GI/Hepatic Neg liver ROS, GERD  ,  Endo/Other  Obesity  Renal/GU negative Renal ROS  negative genitourinary   Musculoskeletal negative musculoskeletal ROS (+) Psoriasis   Abdominal (+) + obese,   Peds  Hematology   Anesthesia Other Findings   Reproductive/Obstetrics (+) Pregnancy                             Anesthesia Physical Anesthesia Plan  ASA: II  Anesthesia Plan: Epidural   Post-op Pain Management:    Induction:   PONV Risk Score and Plan:   Airway Management Planned: Natural Airway  Additional Equipment:   Intra-op Plan:   Post-operative Plan:   Informed Consent: I have reviewed the patients History and Physical, chart, labs and discussed the procedure including the risks, benefits and alternatives for the proposed anesthesia with the patient or authorized representative who has indicated his/her understanding and acceptance.       Plan Discussed with: Anesthesiologist  Anesthesia Plan Comments:         Anesthesia Quick Evaluation

## 2019-04-02 NOTE — Anesthesia Procedure Notes (Signed)
Epidural Patient location during procedure: OB Start time: 04/02/2019 5:22 PM End time: 04/02/2019 5:29 PM  Staffing Anesthesiologist: Josephine Igo, MD Performed: anesthesiologist   Preanesthetic Checklist Completed: patient identified, site marked, surgical consent, pre-op evaluation, timeout performed, IV checked, risks and benefits discussed and monitors and equipment checked  Epidural Patient position: sitting Prep: site prepped and draped and DuraPrep Patient monitoring: continuous pulse ox and blood pressure Approach: midline Location: L3-L4 Injection technique: LOR air  Needle:  Needle type: Tuohy  Needle gauge: 17 G Needle length: 9 cm and 9 Needle insertion depth: 6 cm Catheter type: closed end flexible Catheter size: 19 Gauge Catheter at skin depth: 11 cm Test dose: negative and Other  Assessment Events: blood not aspirated, injection not painful, no injection resistance, negative IV test and no paresthesia  Additional Notes Patient identified. Risks and benefits discussed including failed block, incomplete  Pain control, post dural puncture headache, nerve damage, paralysis, blood pressure Changes, nausea, vomiting, reactions to medications-both toxic and allergic and post Partum back pain. All questions were answered. Patient expressed understanding and wished to proceed. Sterile technique was used throughout procedure. Epidural site was Dressed with sterile barrier dressing. No paresthesias, signs of intravascular injection Or signs of intrathecal spread were encountered.  Patient was more comfortable after the epidural was dosed. Please see RN's note for documentation of vital signs and FHR which are stable. Reason for block:procedure for pain

## 2019-04-02 NOTE — Progress Notes (Signed)
Patient ID: Cassandra Diaz, female   DOB: [birth year], 38 y.o.   MRN: [id number]   Getting uncomfortable  AFVSS gen NAD FHTs 125-130's, mod var, + accels, category 1 toco Q 2-3 min  SVE 4/70/-1  Pt getting epidural, will continue IOL

## 2019-04-03 ENCOUNTER — Encounter (HOSPITAL_COMMUNITY): Payer: Self-pay

## 2019-04-03 LAB — CBC
HCT: 30.9 % — ABNORMAL LOW (ref 36.0–46.0)
Hemoglobin: 10.4 g/dL — ABNORMAL LOW (ref 12.0–15.0)
MCH: 31.5 pg (ref 26.0–34.0)
MCHC: 33.7 g/dL (ref 30.0–36.0)
MCV: 93.6 fL (ref 80.0–100.0)
Platelets: 221 10*3/uL (ref 150–400)
RBC: 3.3 MIL/uL — ABNORMAL LOW (ref 3.87–5.11)
RDW: 12.5 % (ref 11.5–15.5)
WBC: 14.5 10*3/uL — ABNORMAL HIGH (ref 4.0–10.5)
nRBC: 0 % (ref 0.0–0.2)

## 2019-04-03 LAB — RPR: RPR Ser Ql: NONREACTIVE

## 2019-04-03 MED ORDER — COCONUT OIL OIL: 1.0000 "application " | TOPICAL_OIL | Status: DC | PRN

## 2019-04-03 MED ORDER — OXYCODONE HCL 5 MG PO TABS: 5.0000 mg | ORAL_TABLET | ORAL | Status: DC | PRN

## 2019-04-03 MED ORDER — PRENATAL MULTIVITAMIN CH
1.0000 | ORAL_TABLET | Freq: Every day | ORAL | Status: DC
  Administered 2019-04-03 – 2019-04-04 (×2): 1 via ORAL
  Filled 2019-04-03 (×2): qty 1

## 2019-04-03 MED ORDER — ONDANSETRON HCL 4 MG/2ML IJ SOLN
4.0000 mg | INTRAMUSCULAR | Status: DC | PRN
  Administered 2019-04-03: 4 mg via INTRAVENOUS
  Filled 2019-04-03: qty 2

## 2019-04-03 MED ORDER — ZOLPIDEM TARTRATE 5 MG PO TABS: 5.0000 mg | ORAL_TABLET | Freq: Every evening | ORAL | Status: DC | PRN

## 2019-04-03 MED ORDER — WITCH HAZEL-GLYCERIN EX PADS: 1.0000 "application " | MEDICATED_PAD | CUTANEOUS | Status: DC | PRN

## 2019-04-03 MED ORDER — SIMETHICONE 80 MG PO CHEW: 80.0000 mg | CHEWABLE_TABLET | ORAL | Status: DC | PRN

## 2019-04-03 MED ORDER — ONDANSETRON HCL 4 MG PO TABS: 4.0000 mg | ORAL_TABLET | ORAL | Status: DC | PRN

## 2019-04-03 MED ORDER — TETANUS-DIPHTH-ACELL PERTUSSIS 5-2.5-18.5 LF-MCG/0.5 IM SUSP: 0.5000 mL | Freq: Once | INTRAMUSCULAR | Status: DC

## 2019-04-03 MED ORDER — ACETAMINOPHEN 325 MG PO TABS: 650.0000 mg | ORAL_TABLET | ORAL | Status: DC | PRN

## 2019-04-03 MED ORDER — DIBUCAINE (PERIANAL) 1 % EX OINT: 1.0000 "application " | TOPICAL_OINTMENT | CUTANEOUS | Status: DC | PRN

## 2019-04-03 MED ORDER — OXYCODONE HCL 5 MG PO TABS: 10.0000 mg | ORAL_TABLET | ORAL | Status: DC | PRN

## 2019-04-03 MED ORDER — SENNOSIDES-DOCUSATE SODIUM 8.6-50 MG PO TABS
2.0000 | ORAL_TABLET | ORAL | Status: DC
  Administered 2019-04-03: 2 via ORAL
  Filled 2019-04-03: qty 2

## 2019-04-03 MED ORDER — IBUPROFEN 600 MG PO TABS
600.0000 mg | ORAL_TABLET | Freq: Four times a day (QID) | ORAL | Status: DC
  Administered 2019-04-03 – 2019-04-04 (×6): 600 mg via ORAL
  Filled 2019-04-03 (×6): qty 1

## 2019-04-03 MED ORDER — DIPHENHYDRAMINE HCL 25 MG PO CAPS: 25.0000 mg | ORAL_CAPSULE | Freq: Four times a day (QID) | ORAL | Status: DC | PRN

## 2019-04-03 MED ORDER — BENZOCAINE-MENTHOL 20-0.5 % EX AERO
1.0000 "application " | INHALATION_SPRAY | CUTANEOUS | Status: DC | PRN
  Administered 2019-04-03: 1 via TOPICAL
  Filled 2019-04-03: qty 56

## 2019-04-03 NOTE — Lactation Note (Addendum)
This note was copied from a baby's chart. Lactation Consultation Note  Patient Name: Cassandra Diaz WCHEN'I Date: 04/03/2019 Reason for consult: Initial assessment;1st time breastfeeding P1, 5 hour female infant. Infant had one void diaper since delivery.  Per mom, she has Spectra 2 DEBP at home.  When [place] first entered room infant asleep in basinet. LC discussed hand expression and mom taught back hand expression and easily expressed 5 ml of colostrum that was put in a bullet. This mom's third time latching infant at breast, infant breastfeed 5 to 10 minutes in L&D and at 3:25 am infant latched for 25 minutes but it is was painful.  Infant started cuing and mom latched infant on left breast using the cross cradle hold, infant was on and off breast at first but then sustained latch and was still breastfeeding after 7 minutes when [place] left room. Mom plans to offer EBM that was expressed by spoon after infant finish feeding at breast. Per mom, she is only feeling at tug with this latch unlike earlier she was having pain while breastfeeding infant.  Mom knows to breastfeed according to hunger cues, 8 to 12 times within 24 hours and on demand. Mom knows to call Nurse or [place] if she needs assistance with latching infant to breast. Mom will continue to do as much STS as possible. Reviewed Baby & Me book's Breastfeeding Basics.  Mom made aware of O/P services, breastfeeding support groups, community resources, and our phone # for post-discharge questions.  Maternal Data Formula Feeding for Exclusion: No Has patient been taught Hand Expression?: Yes(60ml of EBM will offer after she finishes breastfeeding infant at this feeding.) Does the patient have breastfeeding experience prior to this delivery?: No  Feeding Feeding Type: Breast Fed  LATCH Score Latch: Grasps breast easily, tongue down, lips flanged, rhythmical sucking.  Audible Swallowing: A few with stimulation  Type of Nipple: Everted  at rest and after stimulation  Comfort (Breast/Nipple): Soft / non-tender  Hold (Positioning): Assistance needed to correctly position infant at breast and maintain latch.  LATCH Score: 8  Interventions Interventions: Breast feeding basics reviewed;Breast compression;Assisted with latch;Adjust position;Support pillows;Skin to skin;Position options;Breast massage;Hand express;Expressed milk  Lactation Tools Discussed/Used WIC Program: No   Consult Status Consult Status: Follow-up Date: 04/03/19 Follow-up type: In-patient    Vicente Serene 04/03/2019, 5:00 AM

## 2019-04-03 NOTE — Progress Notes (Signed)
Post Partum Day 1 Subjective: no complaints, up ad lib, voiding, tolerating PO and nl lochia, pain controlled  Objective: Blood pressure 101/87, pulse 75, temperature 98.5 F (36.9 C), temperature source Oral, resp. rate 18, height 5\' 1"  (1.549 m), weight 82.6 kg, last menstrual period 06/06/2018, SpO2 100 %, unknown if currently breastfeeding.  Physical Exam:  General: alert and no distress Lochia: appropriate Uterine Fundus: firm   Recent Labs    04/02/19 1231 04/03/19 0436  HGB 13.3 10.4*  HCT 39.7 30.9*    Assessment/Plan: Plan for discharge tomorrow, Breastfeeding and Lactation consult.  Routine PP care.     LOS: 1 day   Cassandra Diaz 04/03/2019, 6:13 AM

## 2019-04-04 ENCOUNTER — Encounter (HOSPITAL_COMMUNITY): Payer: Self-pay | Admitting: *Deleted

## 2019-04-04 MED ORDER — IBUPROFEN 600 MG PO TABS: 600.0000 mg | ORAL_TABLET | Freq: Four times a day (QID) | ORAL | 1 refills | Status: AC | PRN

## 2019-04-04 MED ORDER — PRENATAL MULTIVITAMIN CH: 1.0000 | ORAL_TABLET | Freq: Every day | ORAL | 1 refills | Status: AC

## 2019-04-04 NOTE — Lactation Note (Signed)
This note was copied from a baby's chart. Lactation Consultation Note  Patient Name: Cassandra Diaz UXLKG'M Date: 04/04/2019  P1, 35 hour female infant. Infant had 3 voids and 9 stools since birth. Per mom, she feels breastfeeding is getting better. Tools given: breast shells, hand pump and using  organic nipple cream  Mom is pre-pumping prior to latching infant to breast and lately infant has been breastfeeding for 30 minutes most feedings. Per mom ,she breastfed infant for 30 minutes prior to [facility] entering the room. LC did not observe a latch at this time. Mom has slight  nipple soreness on right breast only  but no abrasions,  she is using a cream that doesn't contain lanolin. Mom will continue to breastfeed according hunger cues, on demand and 8 to 12 times within 24 hours. Mom knows to call Nurse or [place] if she has any questions, concerns or need assistance with latching infant at breast.       Maternal Data    Feeding Feeding Type: Breast Fed  LATCH Score Latch: Repeated attempts needed to sustain latch, nipple held in mouth throughout feeding, stimulation needed to elicit sucking reflex.  Audible Swallowing: Spontaneous and intermittent  Type of Nipple: Everted at rest and after stimulation  Comfort (Breast/Nipple): Soft / non-tender  Hold (Positioning): Assistance needed to correctly position infant at breast and maintain latch.  LATCH Score: 8  Interventions Interventions: Breast feeding basics reviewed;Assisted with latch;Skin to skin;Position options;Shells;Comfort gels  Lactation Tools Discussed/Used     Consult Status      Vicente Serene 04/04/2019, 3:55 AM

## 2019-04-04 NOTE — Anesthesia Postprocedure Evaluation (Signed)
Anesthesia Post Note  Patient: Automotive engineer  Procedure(s) Performed: AN AD HOC LABOR EPIDURAL     Patient location during evaluation: Mother Baby Anesthesia Type: Epidural Level of consciousness: awake and alert Pain management: pain level controlled Vital Signs Assessment: post-procedure vital signs reviewed and stable Respiratory status: spontaneous breathing, nonlabored ventilation and respiratory function stable Cardiovascular status: stable Postop Assessment: no headache, no backache and epidural receding Anesthetic complications: no    Last Vitals:  Vitals:   04/03/19 2300 04/04/19 0622  BP: 109/82 122/85  Pulse: 63 (!) 56  Resp: 18 18  Temp: 36.7 C 36.4 C  SpO2: 99% 100%    Last Pain:  Vitals:   04/04/19 0621  TempSrc:   PainSc: 0-No pain   Pain Goal:                   Nivin Braniff

## 2019-04-04 NOTE — Discharge Summary (Signed)
OB Discharge Summary     Patient Name: Cassandra Diaz DOB: [birth year] MRN: [id number]  Date of admission: 04/02/2019 Delivering MD: Sherian Rein   Date of discharge: 04/04/2019  Admitting diagnosis: PREG Intrauterine pregnancy: [redacted]w[redacted]d     Secondary diagnosis:  Principal Problem:   SVD (spontaneous vaginal delivery) Active Problems:   Indication for care in labor or delivery  Additional problems: N/A     Discharge diagnosis: Term Pregnancy Delivered                                                                                                Post partum procedures:none  Augmentation: Pitocin  Complications: None  Hospital course:  Onset of Labor With Vaginal Delivery     38 y.o. yo G2P1011 at [redacted]w[redacted]d was admitted in Latent Labor on 04/02/2019. Patient had an uncomplicated labor course as follows:  Membrane Rupture Time/Date: 9:00 AM ,04/02/2019   Intrapartum Procedures: Episiotomy:                                          Lacerations:  2nd degree [3]  Patient had a delivery of a Viable infant. 04/02/2019  Information for the patient's newborn:  Tzippy, T[place] Girl Ellaree [[id number]]  Delivery Method: Vaginal, Spontaneous(Filed from Delivery Summary)     Pateint had an uncomplicated postpartum course.  She is ambulating, tolerating a regular diet, passing flatus, and urinating well. Patient is discharged home in stable condition on 04/04/19.   Physical exam  Vitals:   04/03/19 1045 04/03/19 1430 04/03/19 2300 04/04/19 0622  BP: 107/71 110/73 109/82 122/85  Pulse: 68 72 63 (!) 56  Resp: 18 18 18 18   Temp: 98.6 F (37 C) 98.3 F (36.8 C) 98.1 F (36.7 C) 97.6 F (36.4 C)  TempSrc: Oral Oral Oral   SpO2: 98% 99% 99% 100%  Weight:      Height:       General: alert and no distress Lochia: appropriate Uterine Fundus: firm  Labs: Lab Results  Component Value Date   WBC 14.5 (H) 04/03/2019   HGB 10.4 (L) 04/03/2019   HCT 30.9 (L) 04/03/2019   MCV 93.6  04/03/2019   PLT 221 04/03/2019   No flowsheet data found.  Discharge instruction: per After Visit Summary and "Baby and Me Booklet".  After visit meds:  Allergies as of 04/04/2019   No Known Allergies     Medication List    TAKE these medications   ibuprofen 600 MG tablet Commonly known as: ADVIL Take 1 tablet (600 mg total) by mouth every 6 (six) hours as needed.   naproxen 250 MG tablet Commonly known as: NAPROSYN Take by mouth as needed.   prenatal multivitamin Tabs tablet Take 1 tablet by mouth daily at 12 noon.       Diet: routine diet  Activity: Advance as tolerated. Pelvic rest for 6 weeks.   Outpatient follow up:6 weeks Follow up Appt:No future appointments. Follow up Visit:No follow-ups on file.  Postpartum contraception: Undecided  Newborn Data: Live born female  Birth Weight: 6 lb 7.7 oz (2940 g) APGAR: 28, 9  Newborn Delivery   Birth date/time: 04/02/2019 23:26:00 Delivery type: Vaginal, Spontaneous      Baby Feeding: Breast Disposition:home with mother   04/04/2019 Janyth Contes, MD

## 2019-04-04 NOTE — Progress Notes (Addendum)
Post Partum Day 2 Subjective: no complaints, up ad lib, voiding, tolerating PO and nl lochia, pain controlled  Objective: Blood pressure 122/85, pulse (!) 56, temperature 97.6 F (36.4 C), resp. rate 18, height 5\' 1"  (1.549 m), weight 82.6 kg, last menstrual period 06/06/2018, SpO2 100 %, unknown if currently breastfeeding.  Physical Exam:  General: alert and no distress Lochia: appropriate Uterine Fundus: firm   Recent Labs    04/02/19 1231 04/03/19 0436  HGB 13.3 10.4*  HCT 39.7 30.9*    Assessment/Plan: Discharge home and Breastfeeding.  Routine PP care.  D/C with Motrin and PNV.  F/u 6 weeks   LOS: 2 days   Cassandra Diaz 04/04/2019, 8:39 AM

## 2019-04-14 ENCOUNTER — Ambulatory Visit: Payer: Self-pay

## 2019-04-14 NOTE — Lactation Note (Signed)
This note was copied from a baby's chart. Lactation Consultation Note  Patient Name: Cassandra Diaz MVHQI'OToday's Date: 04/14/2019     04/14/2019  Name: Cassandra Diaz MRN: [id number] Date of Birth: 04/02/2017 Gestational Age: Gestational Age: 5378w6d Birth Weight: 103.7 oz Weight today:    6 pounds 14.1 ounces (3120 grams) with clean newborn diaper  12 day old ET infant presents today with mom and dad for feeding assessment. Infant was fed an hour prior to appt.   Infant has gained 345 grams in the last 10 days with an average daily weight gain of 35 grams a day.   Mom reports infant was latching in the hospital with a lot of pain with each feeding. Infant was feeding for long periods. Mom with cracked and bleeding nipples. Infant lost 10% of her birth weight and started formula. Mom's milk was in on day 5. Mom is pumping 7 x a day and getting about 10 ounces a day.   Mom reports infant has not latched since the hospital. Infant with a small mouth and mom with inelastic breast tissue that may be causing issues with deep latch. Infant with short thick labial frenulum that inserts at the bottom of the gum ridge. She has a short posterior lingual frenulum that may be impeding deep latch and milk flow. Infant slips off the nipple with feeding and does the same on the bottle. Discussed tongue and lip briefly with parents and discussed that infant also has a small mouth. Will reassess at next feeding assessment.   Infant is being fed with a Lansinoh slow flow nipple and is tolerating feedings well. Infant tended to slip off the bottle and enc parents to try a Dr. Theora GianottiBrown's Nipple that is less bulbous to see if infant maintains a deeper latch with it.   Mom's nipples are healing and she is using the Silverette cups. She is noting that they are healing. Infant compressed nipple with feeding today.   Infant was latched to the right breast without the NS. She needed her lips flanged after  latch. Nipple with crease after latch, mom with pain with feeding although not as severe as she was experiencing. # 24 NS was placed and infant latched and fed intermittently. Infant not very hungry and did not feed well on the breast. Mom said the latch was much less painful with the Nipple Shield. We discussed continuing to pump to protect milk supply and goal of weaning off NS as infant is able.   Reviewed Fenugreek, moringa, and Reglan dosages and side effects. Mom has a low milk supply. Mom using a Spectra pump with # 28 flanges, advised mom to switch to her smaller flange size and see if that helps. Reviewed hands on pumping, relaxing with pumping, hands free bra. Mom is power pumping once a day.   Infant to follow up with Elza RafterJenny Hansen on Dec. 3. Infant to follow up with Lactation in 2 weeks.       General Information: Mother's reason for visit: Feeding assessment, sore nipples, infant weight loss, not latching well Consult: Initial Lactation consultant: Noralee StainSharon Arch Methot RN,IBCLC Breastfeeding experience: not latching in the last week Maternal medical conditions: Other(Positive breast changes with pregnancy) Maternal medications: Pre-natal vitamin, Iron, Motrin (ibuprofen), Stool softener  Breastfeeding History: Frequency of breast feeding: not latching currently    Supplementation: Supplement method: bottle(Lansinoh, slow flow nipple) Brand: Similac Formula volume: 2-2.5 ounces Formula frequency: 1/2 of her feedings Total formula volume per day: 10 ounces  Breast milk volume: 2-2.5 ounces Breast milk frequency: 1/2 of her feedings Total breast milk volume per day: 10 ounces Pump type: Spectra Pump frequency: 7 x a day Pump volume: 1.5- 2 ounces- 10 ounces a day  Infant Output Assessment: Voids per 24 hours: 12 Urine color: Clear yellow Stools per 24 hours: 8+ Stool color: Yellow  Breast Assessment: Breast: Filling, Compressible Nipple: Erect, Cracked, Scabs   Pain  interventions: Bra, Other, Breast pump, Nipple shield(Silverettes)  Feeding Assessment: Infant oral assessment: Variance Infant oral assessment comment: see note Positioning: Cross cradle(15 minutes, off and on) Latch: 1 - Repeated attempts needed to sustain latch, nipple held in mouth throughout feeding, stimulation needed to elicit sucking reflex. Audible swallowing: 1 - A few with stimulation Type of nipple: 2 - Everted at rest and after stimulation Comfort: 1 - Filling, red/small blisters or bruises, mild/mod discomfort Hold: 1 - Assistance needed to correctly position infant at breast and maintain latch LATCH score: 6 Latch assessment: Shallow Lips flanged: No(Upper lip needs flanging on the breast) Suck assessment: Displays both Tools: Nipple shield 24 mm Pre-feed weight: 3120 grams Post feed weight: 3124 grams Amount transferred: 4 ml Amount supplemented: 40 ml  Additional Feeding Assessment:                                    Totals: Total amount transferred: 4 ml Total supplement given: 40 ml Total amount pumped post feed: did not pump   Donn Pierini RN, IBCLC                                                  Plan:  1.Offer infant the breast with feeding cues 2-3 x a day Limit breast feeding to 20 minutes if infant is not active or sleepy at the breast Offer infant a bottle of pumped breast milk or formula after breast feeding 2. Stimulate infant to keep infant awake at the breast as needed  3. Feed infant skin to skin 4. Massage/compress the breast with feedings to keep infant active  5. Use the # 24 Nipple Shield with feeding as needed to help infant stay latched. Goal is to wean off of the NS as soon as infant is able. Try each day without it to see when infant is able to do without it 6. When offering the bottle, feed infant using the paced bottle feeding method (video on kellymom.com) 7. Infant  needs about 58-78 ml (2-2.5 ounces) for 8 feedings a day or 465-620 ml (16-21 ounces) in 24 hours. Infant may take more or less depending on how often she feeds. Infant may take more or less depending on how often she feeds.  8. Continue pumping 7-8 x a day for 15-20 minutes with your Spectra pump to protect and promote milk supply. Try your # 24 flanges with your pump 9 Fenugreek may be helpful for increasing your milk supply. Mother Love More Milk Special Blend may be used instead as the [place] may be helpful.  10. Some mom's find that Reglan can be helpful. It has to be obtained by Prescription from your OB. Some OB's will not prescribe. The usual dosage is 10 mg 3 times a day for 10-30 days. The side effects can be sleepiness, depression or  Tardive Diskinesia.  11. Keep up the good work 12. Thank you for allowing me to assist you today 13. Please call with questions or concerns as needed [phone] 14. Follow up with Lactation in 2 weeks   Ed Blalock RN, IBCLC                                                     Silas Flood Kate Sweetman 04/14/2019, 8:20 AM

## 2019-07-03 ENCOUNTER — Ambulatory Visit: Payer: PRIVATE HEALTH INSURANCE | Attending: Internal Medicine

## 2019-07-03 DIAGNOSIS — Z23 Encounter for immunization: Secondary | ICD-10-CM | POA: Insufficient documentation

## 2019-07-03 NOTE — Progress Notes (Signed)
   Covid-19 Vaccination Clinic  Name:  Cassandra Diaz    MRN: [id number] DOB: [birth year]  07/03/2019  [name] was observed post Covid-19 immunization for 15 minutes without incidence. She was provided with Vaccine Information Sheet and instruction to access the V-Safe system.   [name] was instructed to call 911 with any severe reactions post vaccine: Marland Kitchen Difficulty breathing  . Swelling of your face and throat  . A fast heartbeat  . A bad rash all over your body  . Dizziness and weakness    Immunizations Administered    Name Date Dose VIS Date Route   Pfizer COVID-19 Vaccine 07/03/2019  8:16 AM 0.3 mL 04/30/2019 Intramuscular   Manufacturer: [employer], [employer]   Lot: [id number]   NDC: [address]-4

## 2019-07-25 ENCOUNTER — Ambulatory Visit: Payer: PRIVATE HEALTH INSURANCE | Attending: Internal Medicine

## 2019-07-25 DIAGNOSIS — Z23 Encounter for immunization: Secondary | ICD-10-CM | POA: Insufficient documentation

## 2019-07-25 NOTE — Progress Notes (Signed)
   Covid-19 Vaccination Clinic  Name:  Cassandra Diaz    MRN: [id number] DOB: [birth year]  07/25/2019  Cassandra Diaz was observed post Covid-19 immunization for 15 minutes without incident. She was provided with Vaccine Information Sheet and instruction to access the V-Safe system.   Cassandra Diaz was instructed to call 911 with any severe reactions post vaccine: Marland Kitchen Difficulty breathing  . Swelling of face and throat  . A fast heartbeat  . A bad rash all over body  . Dizziness and weakness   Immunizations Administered    Name Date Dose VIS Date Route   Pfizer COVID-19 Vaccine 07/25/2019  9:17 AM 0.3 mL 04/30/2019 Intramuscular   Manufacturer: [employer], [employer]   Lot: [id number]   NDC: [address]-6

## 2020-02-19 IMAGING — US US OB COMP LESS 14 WK
1 series · 15 of 28 positions shown · non-contrast
Comparison: None.

CLINICAL DATA: Vaginal bleeding.

EXAM:
OBSTETRIC <14 WK US AND TRANSVAGINAL OB US
TECHNIQUE: Both transabdominal and transvaginal ultrasound examinations were
performed for complete evaluation of the gestation as well as the
maternal uterus, adnexal regions, and pelvic cul-de-sac.
Transvaginal technique was performed to assess early pregnancy.

[Series 1: us ob comp less 14 wk · 15 of 49 slices shown]
[im 1/49]
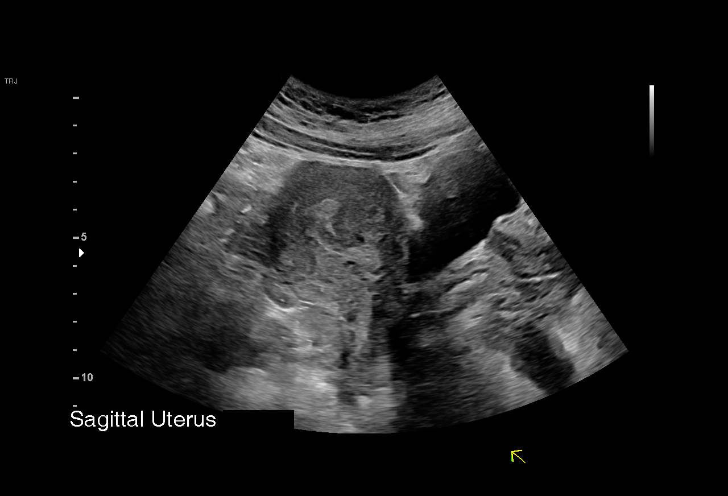
[im 4/49]
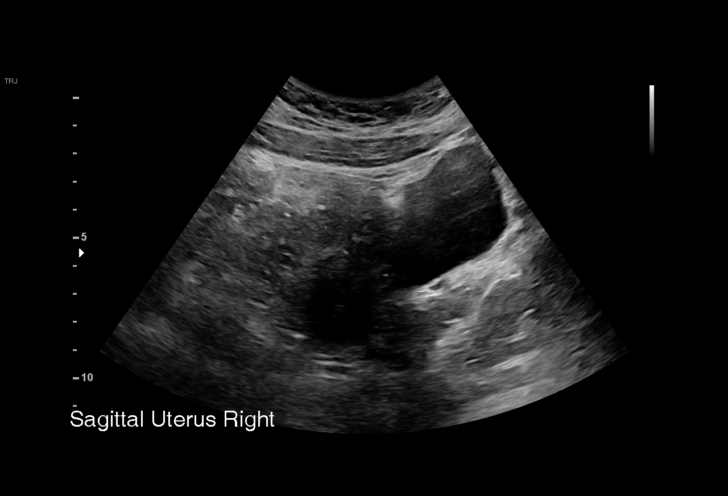
[im 8/49]
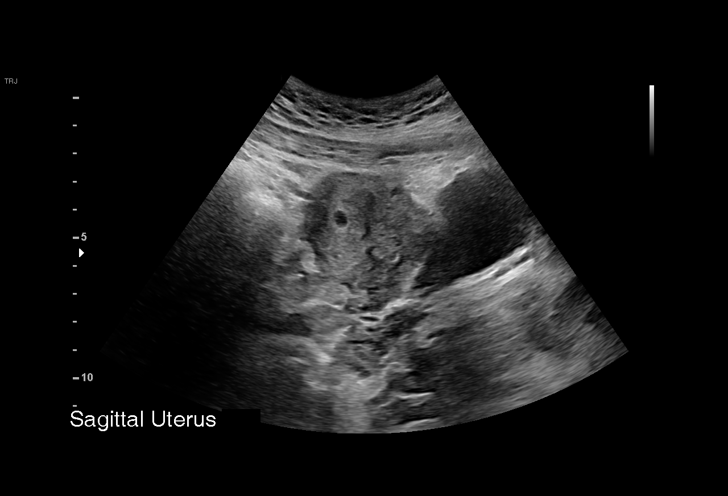
[im 11/49]
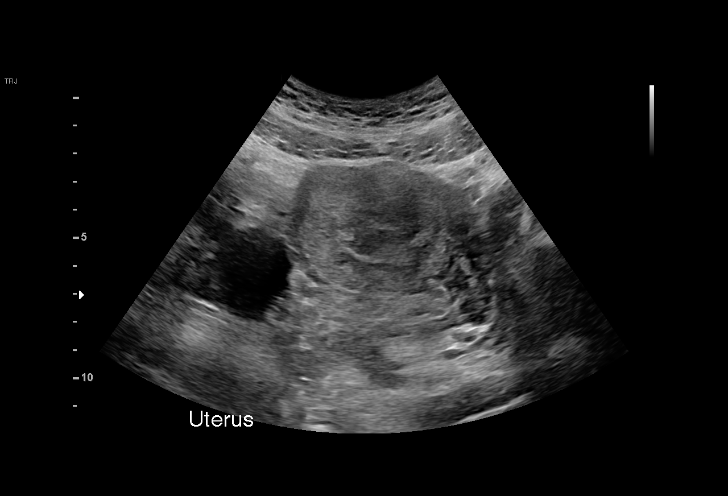
[im 15/49]
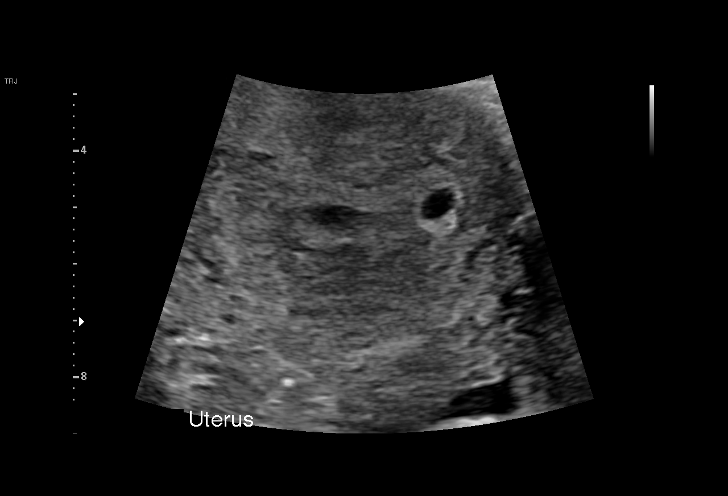
[im 18/49]
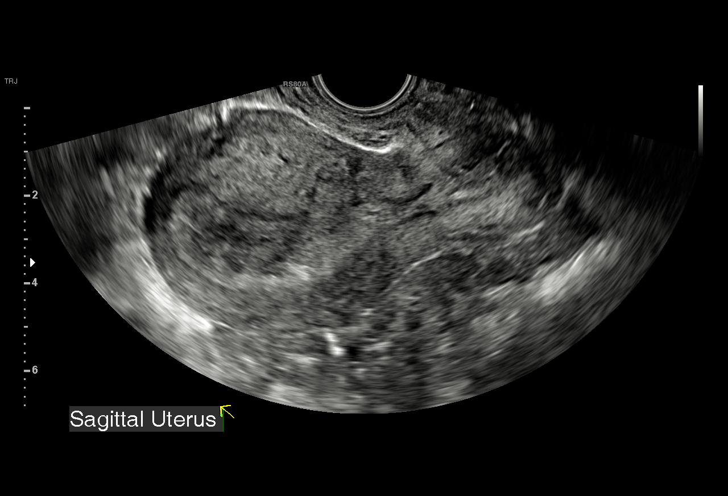
[im 22/49]
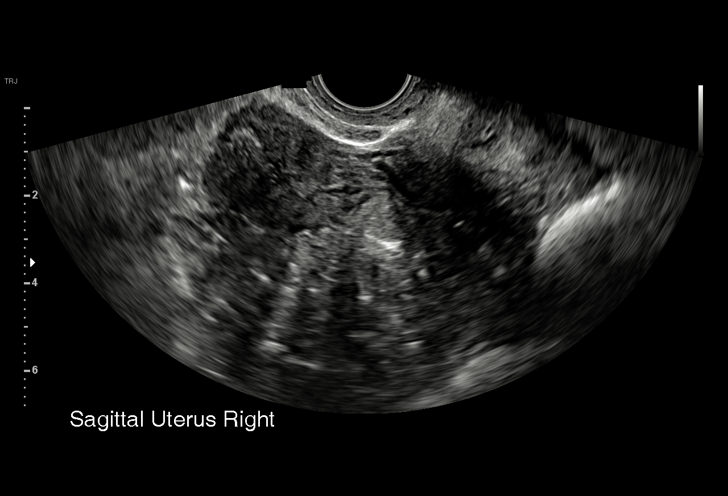
[im 25/49]
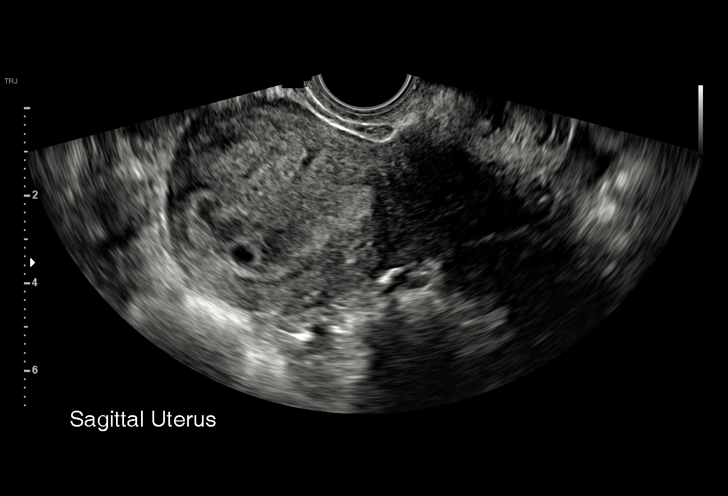
[im 27/49]
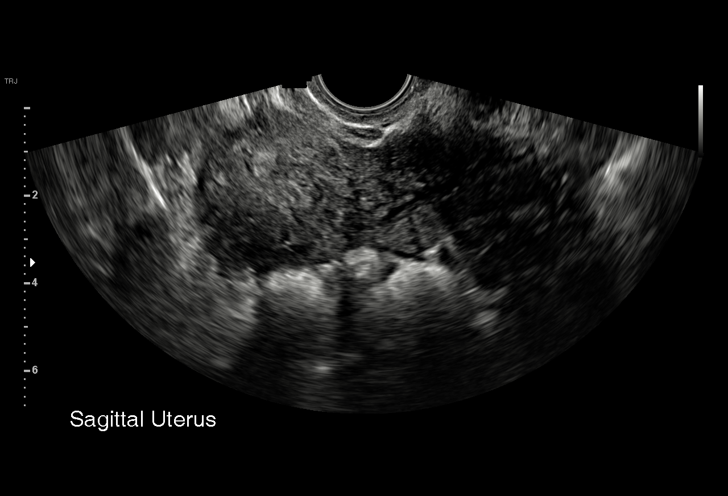
[im 31/49]
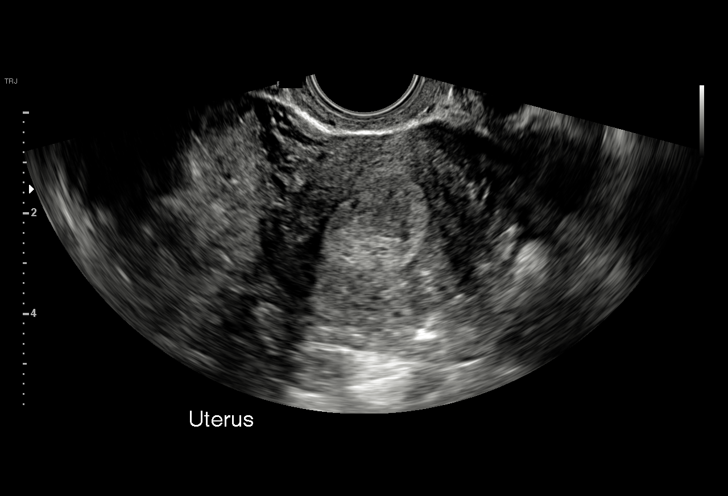
[im 34/49]
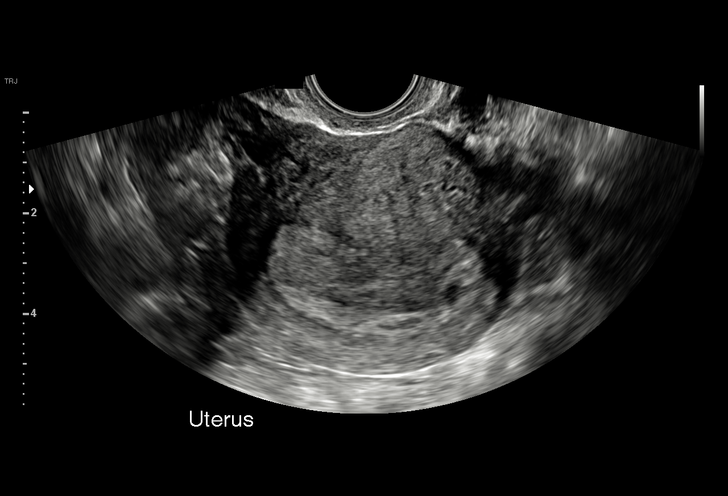
[im 38/49]
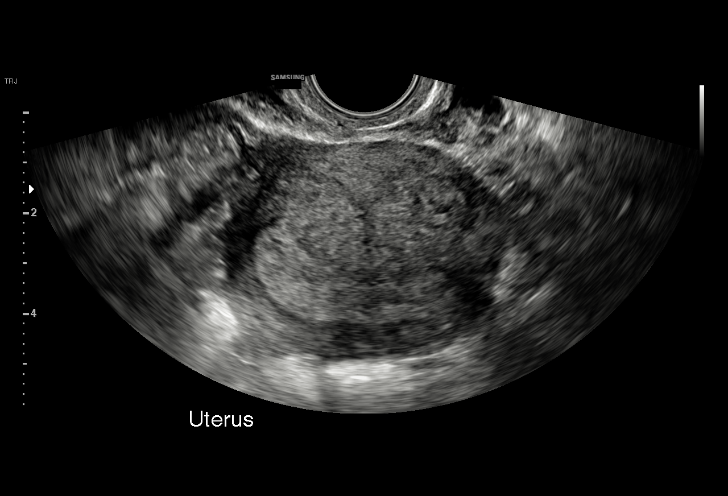
[im 41/49]
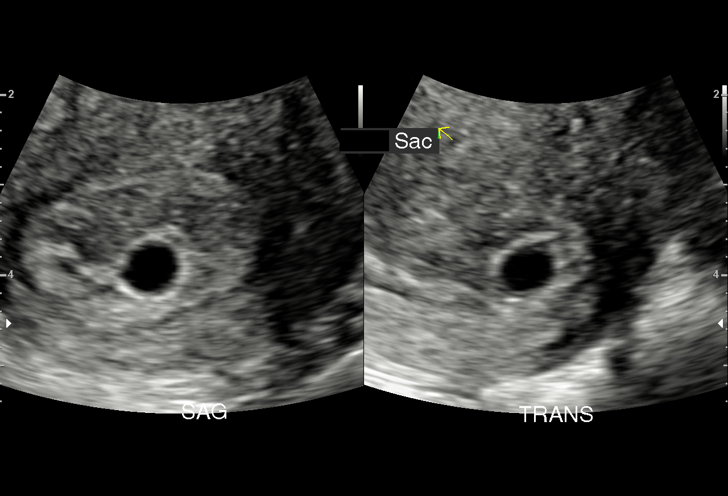
[im 45/49]
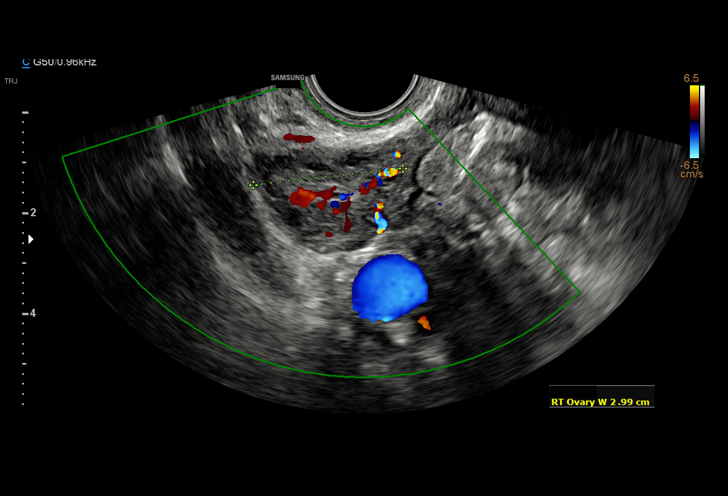
[im 49/49]
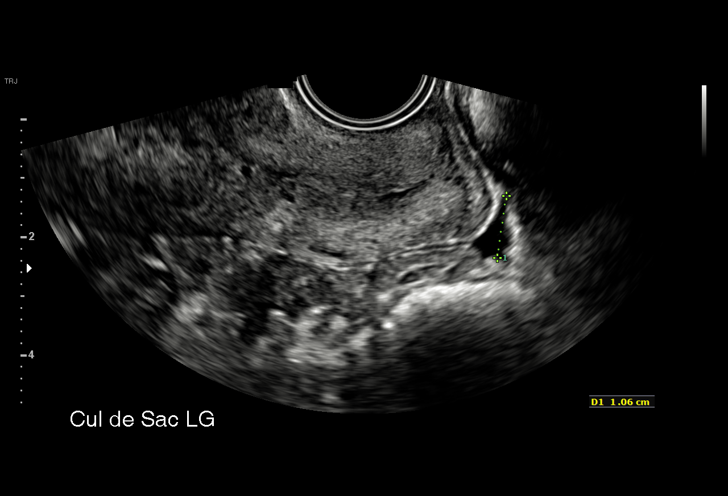

[15 of 28 positions shown; findings below may reference images not displayed]

FINDINGS: Intrauterine gestational sac: Single

Yolk sac:  No

Embryo:  None

Cardiac Activity: No

MSD: 5.94 mm   5 w   2 d

Maternal uterus/adnexae:

Subchorionic hemorrhage: None

Right ovary: Simple cyst in the right ovary measures 3.5 cm.

Left ovary: Appears normal.

Other :None

Free fluid:  Trace
IMPRESSION: 1. Probable early intrauterine gestational sac, but no yolk sac,
fetal pole, or cardiac activity yet visualized. Recommend follow-up
quantitative B-HCG levels and follow-up US in 14 days to confirm and
assess viability. This recommendation follows SRU consensus
guidelines: Diagnostic Criteria for Nonviable Pregnancy Early in the
First Trimester. N Engl J Med 0681; [DATE]. Simple appearing
right ovary cyst.
2. Trace free fluid within the pelvis.

## 2022-03-27 DIAGNOSIS — L2089 Other atopic dermatitis: Secondary | ICD-10-CM | POA: Diagnosis not present

## 2022-03-27 DIAGNOSIS — L810 Postinflammatory hyperpigmentation: Secondary | ICD-10-CM | POA: Diagnosis not present

## 2022-07-19 DIAGNOSIS — Z7689 Persons encountering health services in other specified circumstances: Secondary | ICD-10-CM | POA: Diagnosis not present

## 2022-07-19 DIAGNOSIS — F32A Depression, unspecified: Secondary | ICD-10-CM | POA: Diagnosis not present

## 2022-07-19 DIAGNOSIS — N760 Acute vaginitis: Secondary | ICD-10-CM | POA: Diagnosis not present

## 2022-07-19 DIAGNOSIS — B3731 Acute candidiasis of vulva and vagina: Secondary | ICD-10-CM | POA: Diagnosis not present

## 2022-07-29 DIAGNOSIS — Z6824 Body mass index (BMI) 24.0-24.9, adult: Secondary | ICD-10-CM | POA: Diagnosis not present

## 2022-07-29 DIAGNOSIS — Z130 Encounter for screening for diseases of the blood and blood-forming organs and certain disorders involving the immune mechanism: Secondary | ICD-10-CM | POA: Diagnosis not present

## 2022-07-29 DIAGNOSIS — Z1389 Encounter for screening for other disorder: Secondary | ICD-10-CM | POA: Diagnosis not present

## 2022-07-29 DIAGNOSIS — Z01419 Encounter for gynecological examination (general) (routine) without abnormal findings: Secondary | ICD-10-CM | POA: Diagnosis not present

## 2022-08-27 DIAGNOSIS — Z13220 Encounter for screening for lipoid disorders: Secondary | ICD-10-CM | POA: Diagnosis not present

## 2022-08-27 DIAGNOSIS — Z0000 Encounter for general adult medical examination without abnormal findings: Secondary | ICD-10-CM | POA: Diagnosis not present

## 2022-08-27 DIAGNOSIS — Z114 Encounter for screening for human immunodeficiency virus [HIV]: Secondary | ICD-10-CM | POA: Diagnosis not present

## 2022-08-30 DIAGNOSIS — H6123 Impacted cerumen, bilateral: Secondary | ICD-10-CM | POA: Diagnosis not present

## 2022-08-30 DIAGNOSIS — Z0000 Encounter for general adult medical examination without abnormal findings: Secondary | ICD-10-CM | POA: Diagnosis not present
